# Patient Record
Sex: Female | Born: 1965 | Race: Black or African American | Hispanic: No | Marital: Single | Smoking: Never smoker
Health system: Southern US, Community
[De-identification: ages and names within clinical notes are randomized; demographics above are authoritative.]

## PROBLEM LIST (undated history)

## (undated) DIAGNOSIS — E785 Hyperlipidemia, unspecified: Secondary | ICD-10-CM

## (undated) DIAGNOSIS — G4733 Obstructive sleep apnea (adult) (pediatric): Secondary | ICD-10-CM

## (undated) DIAGNOSIS — R7989 Other specified abnormal findings of blood chemistry: Secondary | ICD-10-CM

## (undated) DIAGNOSIS — E042 Nontoxic multinodular goiter: Secondary | ICD-10-CM

## (undated) DIAGNOSIS — E559 Vitamin D deficiency, unspecified: Secondary | ICD-10-CM

## (undated) DIAGNOSIS — R011 Cardiac murmur, unspecified: Secondary | ICD-10-CM

## (undated) DIAGNOSIS — M5430 Sciatica, unspecified side: Secondary | ICD-10-CM

## (undated) DIAGNOSIS — E119 Type 2 diabetes mellitus without complications: Secondary | ICD-10-CM

## (undated) DIAGNOSIS — K219 Gastro-esophageal reflux disease without esophagitis: Secondary | ICD-10-CM

## (undated) DIAGNOSIS — I10 Essential (primary) hypertension: Secondary | ICD-10-CM

## (undated) DIAGNOSIS — E041 Nontoxic single thyroid nodule: Secondary | ICD-10-CM

## (undated) DIAGNOSIS — K760 Fatty (change of) liver, not elsewhere classified: Secondary | ICD-10-CM

## (undated) HISTORY — DX: Nontoxic single thyroid nodule: E04.1

## (undated) HISTORY — PX: TONSILLECTOMY: SUR1361

## (undated) HISTORY — DX: Type 2 diabetes mellitus without complications: E11.9

## (undated) HISTORY — DX: Vitamin D deficiency, unspecified: E55.9

## (undated) HISTORY — DX: Hyperlipidemia, unspecified: E78.5

## (undated) HISTORY — DX: Nontoxic multinodular goiter: E04.2

## (undated) HISTORY — DX: Essential (primary) hypertension: I10

## (undated) HISTORY — DX: Other specified abnormal findings of blood chemistry: R79.89

## (undated) HISTORY — DX: Gastro-esophageal reflux disease without esophagitis: K21.9

## (undated) HISTORY — DX: Sciatica, unspecified side: M54.30

## (undated) HISTORY — DX: Fatty (change of) liver, not elsewhere classified: K76.0

## (undated) HISTORY — DX: Obstructive sleep apnea (adult) (pediatric): G47.33

## (undated) HISTORY — DX: Cardiac murmur, unspecified: R01.1

---

## 1998-04-25 ENCOUNTER — Other Ambulatory Visit: Admission: RE | Admit: 1998-04-25 | Discharge: 1998-04-25 | Payer: Self-pay | Admitting: Obstetrics and Gynecology

## 1999-04-24 ENCOUNTER — Other Ambulatory Visit: Admission: RE | Admit: 1999-04-24 | Discharge: 1999-04-24 | Payer: Self-pay | Admitting: Obstetrics and Gynecology

## 2000-11-21 ENCOUNTER — Other Ambulatory Visit: Admission: RE | Admit: 2000-11-21 | Discharge: 2000-11-21 | Payer: Self-pay | Admitting: Obstetrics and Gynecology

## 2016-10-28 ENCOUNTER — Encounter: Payer: Self-pay | Admitting: Gastroenterology

## 2016-12-10 ENCOUNTER — Other Ambulatory Visit: Payer: Self-pay

## 2016-12-10 ENCOUNTER — Ambulatory Visit (AMBULATORY_SURGERY_CENTER): Payer: Self-pay

## 2016-12-10 VITALS — Ht 61.0 in | Wt 171.0 lb

## 2016-12-10 DIAGNOSIS — Z1211 Encounter for screening for malignant neoplasm of colon: Secondary | ICD-10-CM

## 2016-12-10 MED ORDER — NA SULFATE-K SULFATE-MG SULF 17.5-3.13-1.6 GM/177ML PO SOLN: 1.0000 | Freq: Once | ORAL | 0 refills | Status: AC

## 2016-12-10 NOTE — Progress Notes (Signed)
Denies allergies to eggs or soy products. Denies complication of anesthesia or sedation. Denies use of weight loss medication. Denies use of O2.   Emmi instructions declined.  

## 2016-12-31 ENCOUNTER — Ambulatory Visit (AMBULATORY_SURGERY_CENTER): Payer: BLUE CROSS/BLUE SHIELD | Admitting: Gastroenterology

## 2016-12-31 ENCOUNTER — Other Ambulatory Visit: Payer: Self-pay

## 2016-12-31 ENCOUNTER — Encounter: Payer: Self-pay | Admitting: Gastroenterology

## 2016-12-31 VITALS — BP 134/68 | HR 99 | Temp 97.3°F | Resp 24 | Ht 61.0 in | Wt 171.0 lb

## 2016-12-31 DIAGNOSIS — Z1211 Encounter for screening for malignant neoplasm of colon: Secondary | ICD-10-CM

## 2016-12-31 DIAGNOSIS — D122 Benign neoplasm of ascending colon: Secondary | ICD-10-CM

## 2016-12-31 MED ORDER — SODIUM CHLORIDE 0.9 % IV SOLN: 500.0000 mL | INTRAVENOUS | Status: DC

## 2016-12-31 NOTE — Op Note (Signed)
[facility] Patient Name: Lori Bruce Procedure Date: 12/31/2016 9:05 AM MRN: [id number] Endoscopist: Remo Lipps P. Armbruster MD, MD Age: 51 Referring MD:  Date of Birth: 21-Sep-1965 Gender: Female Account #: [id number] Procedure:                Colonoscopy Indications:              Screening for colorectal malignant neoplasm, This                            is the patient's first colonoscopy Medicines:                Monitored Anesthesia Care Procedure:                Pre-Anesthesia Assessment:                           - Prior to the procedure, a History and Physical                            was performed, and patient medications and                            allergies were reviewed. The patient's tolerance of                            previous anesthesia was also reviewed. The risks                            and benefits of the procedure and the sedation                            options and risks were discussed with the patient.                            All questions were answered, and informed consent                            was obtained. Prior Anticoagulants: The patient has                            taken no previous anticoagulant or antiplatelet                            agents. ASA Grade Assessment: II - A patient with                            mild systemic disease. After reviewing the risks                            and benefits, the patient was deemed in                            satisfactory condition to undergo the procedure.  After obtaining informed consent, the colonoscope                            was passed under direct vision. Throughout the                            procedure, the patient's blood pressure, pulse, and                            oxygen saturations were monitored continuously. The                            Colonoscope was introduced through the anus and                            advanced to the the  cecum, identified by                            appendiceal orifice and ileocecal valve. The                            colonoscopy was performed without difficulty. The                            patient tolerated the procedure well. The quality                            of the bowel preparation was good. The ileocecal                            valve, appendiceal orifice, and rectum were                            photographed. Scope In: 9:07:10 AM Scope Out: 9:18:50 AM Scope Withdrawal Time: 0 hours 10 minutes 37 seconds  Total Procedure Duration: 0 hours 11 minutes 40 seconds  Findings:                 The perianal and digital rectal examinations were                            normal.                           A 5 mm polyp was found in the ascending colon. The                            polyp was sessile. The polyp was removed with a                            cold snare. Resection and retrieval were complete.                           Multiple medium-mouthed diverticula were found in  the sigmoid colon.                           Internal hemorrhoids were found during                            retroflexion. The hemorrhoids were small.                           The exam was otherwise without abnormality. Complications:            No immediate complications. Estimated blood loss:                            Minimal. Estimated Blood Loss:     Estimated blood loss was minimal. Impression:               - One 5 mm polyp in the ascending colon, removed                            with a cold snare. Resected and retrieved.                           - Diverticulosis in the sigmoid colon.                           - Internal hemorrhoids.                           - The examination was otherwise normal. Recommendation:           - Patient has a contact number available for                            emergencies. The signs and symptoms of potential                             delayed complications were discussed with the                            patient. Return to normal activities tomorrow.                            Written discharge instructions were provided to the                            patient.                           - Resume previous diet.                           - Continue present medications.                           - Await pathology results.                           -  Repeat colonoscopy is recommended for                            surveillance. The colonoscopy date will be                            determined after pathology results from today's                            exam become available for review.                           - No ibuprofen, naproxen, or other non-steroidal                            anti-inflammatory drugs for 2 weeks after polyp                            removal. Remo Lipps P. Armbruster MD, MD 12/31/2016 9:23:47 AM This report has been signed electronically.

## 2016-12-31 NOTE — Patient Instructions (Signed)
YOU HAD AN ENDOSCOPIC PROCEDURE TODAY AT [facility]:   Refer to the procedure report that was given to you for any specific questions about what was found during the examination.  If the procedure report does not answer your questions, please call your gastroenterologist to clarify.  If you requested that your care partner not be given the details of your procedure findings, then the procedure report has been included in a sealed envelope for you to review at your convenience later.  YOU SHOULD EXPECT: Some feelings of bloating in the abdomen. Passage of more gas than usual.  Walking can help get rid of the air that was put into your GI tract during the procedure and reduce the bloating. If you had a lower endoscopy (such as a colonoscopy or flexible sigmoidoscopy) you may notice spotting of blood in your stool or on the toilet paper. If you underwent a bowel prep for your procedure, you may not have a normal bowel movement for a few days.  Please Note:  You might notice some irritation and congestion in your nose or some drainage.  This is from the oxygen used during your procedure.  There is no need for concern and it should clear up in a day or so.  SYMPTOMS TO REPORT IMMEDIATELY:   Following lower endoscopy (colonoscopy or flexible sigmoidoscopy):  Excessive amounts of blood in the stool  Significant tenderness or worsening of abdominal pains  Swelling of the abdomen that is new, acute  Fever of 100F or higher Please see handouts on Diverticulosis, Hemorrhoids and Polyps.  For urgent or emergent issues, a gastroenterologist can be reached at any hour by calling [phone].   DIET:  We do recommend a small meal at first, but then you may proceed to your regular diet.  Drink plenty of fluids but you should avoid alcoholic beverages for 24 hours.  ACTIVITY:  You should plan  easy for the rest of today and you should NOT DRIVE or use heavy machinery until tomorrow (because  of the sedation medicines used during the test).    FOLLOW UP: Our staff will call the number listed on your records the next business day following your procedure to check on you and address any questions or concerns that you may have regarding the information given to you following your procedure. If we do not reach you, we will leave a message.  However, if you are feeling well and you are not experiencing any problems, there is no need to return our call.  We will assume that you have returned to your regular daily activities without incident.  If any biopsies were taken you will be contacted by phone or by letter within the next 1-3 weeks.  Please call us at [phone] if you have not heard about the biopsies in 3 weeks.    SIGNATURES/CONFIDENTIALITY: You and/or your care partner have signed paperwork which will be entered into your electronic medical record.  These signatures attest to the fact that that the information above on your After Visit Summary has been reviewed and is understood.  Full responsibility of the confidentiality of this discharge information lies with you and/or your care-partner.  Thank you for letting us take care of your healthcare needs today.

## 2016-12-31 NOTE — Progress Notes (Signed)
Called to room to assist during endoscopic procedure.  Patient ID and intended procedure confirmed with present staff. Received instructions for my participation in the procedure from the performing physician.  

## 2016-12-31 NOTE — Progress Notes (Signed)
To PACU, VSS. Report to [email or web address] 

## 2017-01-04 ENCOUNTER — Encounter: Payer: Self-pay | Admitting: Gastroenterology

## 2017-01-05 ENCOUNTER — Telehealth: Payer: Self-pay | Admitting: *Deleted

## 2017-01-05 NOTE — Telephone Encounter (Signed)
  Follow up Call-  Call back number 12/31/2016  Post procedure Call Back phone  # [phone]  Permission to leave phone message Yes  Some recent data might be hidden     Patient questions:  Do you have a fever, pain , or abdominal swelling? No. Pain Score  0 *  Have you tolerated food without any problems? Yes.    Have you been able to return to your normal activities? Yes.    Do you have any questions about your discharge instructions: Diet   No. Medications  No. Follow up visit  No.  Do you have questions or concerns about your Care? No.  Actions: * If pain score is 4 or above: No action needed, pain <4.

## 2018-03-13 ENCOUNTER — Other Ambulatory Visit: Payer: Self-pay | Admitting: Obstetrics and Gynecology

## 2018-03-13 DIAGNOSIS — N6489 Other specified disorders of breast: Secondary | ICD-10-CM

## 2018-03-15 ENCOUNTER — Ambulatory Visit: Admission: RE | Admit: 2018-03-15 | Payer: BLUE CROSS/BLUE SHIELD | Source: Ambulatory Visit

## 2018-03-15 ENCOUNTER — Ambulatory Visit
Admission: RE | Admit: 2018-03-15 | Discharge: 2018-03-15 | Disposition: A | Discharge status: Home / Self Care | Payer: BLUE CROSS/BLUE SHIELD | Source: Ambulatory Visit | Attending: Obstetrics and Gynecology | Admitting: Obstetrics and Gynecology

## 2018-03-15 DIAGNOSIS — N6489 Other specified disorders of breast: Secondary | ICD-10-CM

## 2019-08-01 IMAGING — MG DIGITAL DIAGNOSTIC UNILATERAL RIGHT MAMMOGRAM WITH TOMO AND CAD
3 series · 3 of 7 positions shown · non-contrast
Comparison: Previous exam(s).

CLINICAL DATA: Possible asymmetry in the outer right breast in the
craniocaudal projection of a recent screening mammogram.

EXAM:
DIGITAL DIAGNOSTIC UNILATERAL RIGHT MAMMOGRAM WITH CAD AND TOMO

[R CC synth-2D]
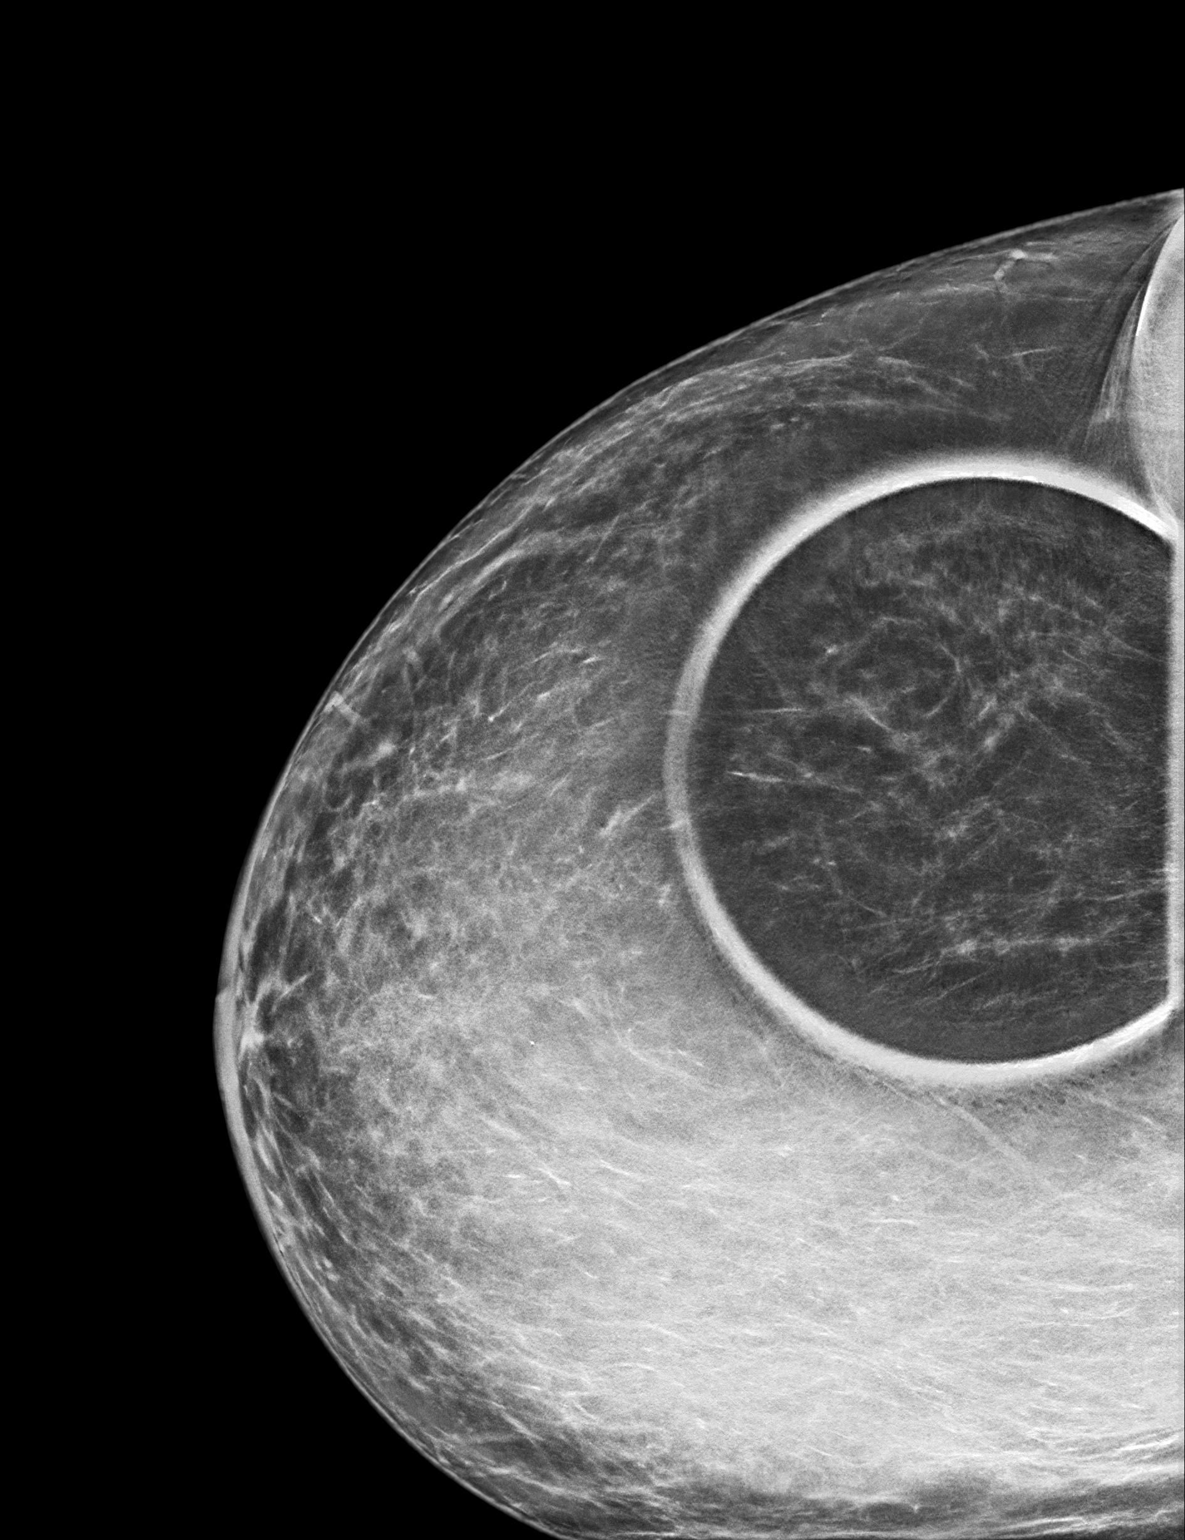

[R ML synth-2D]
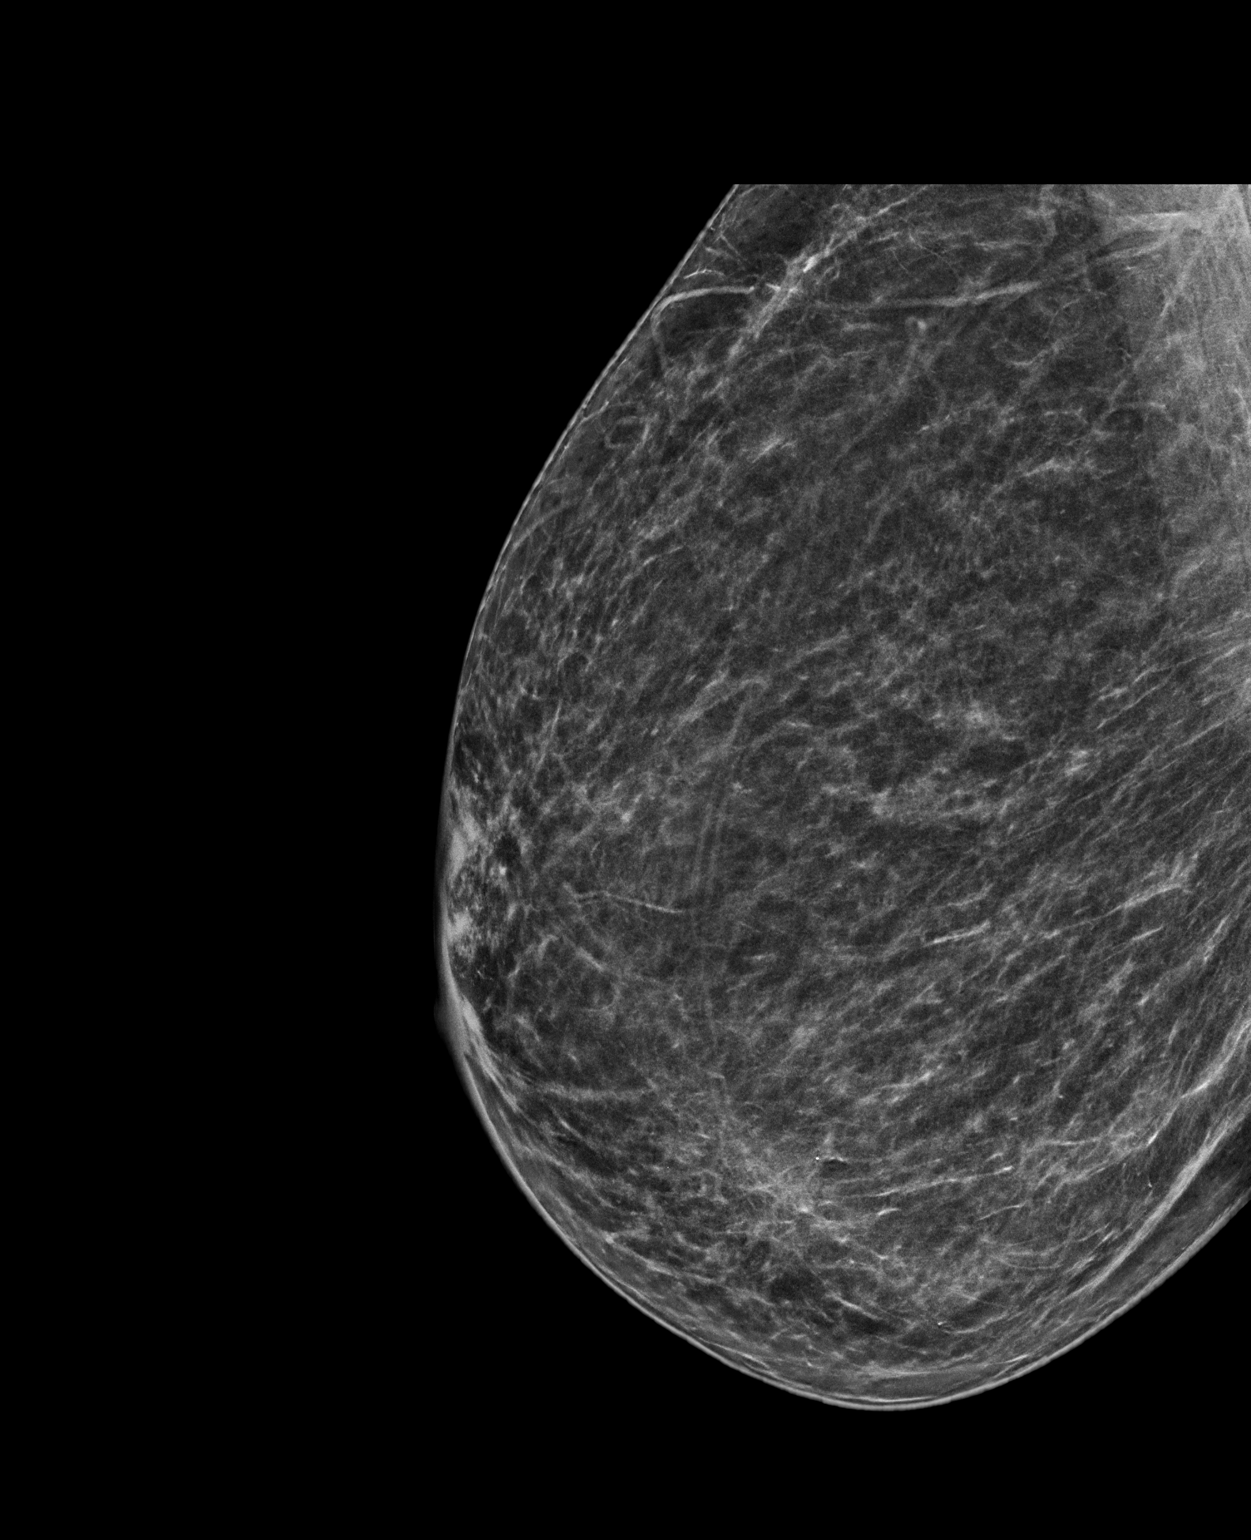

[R CC tomo · tomo slice 39/76.0]
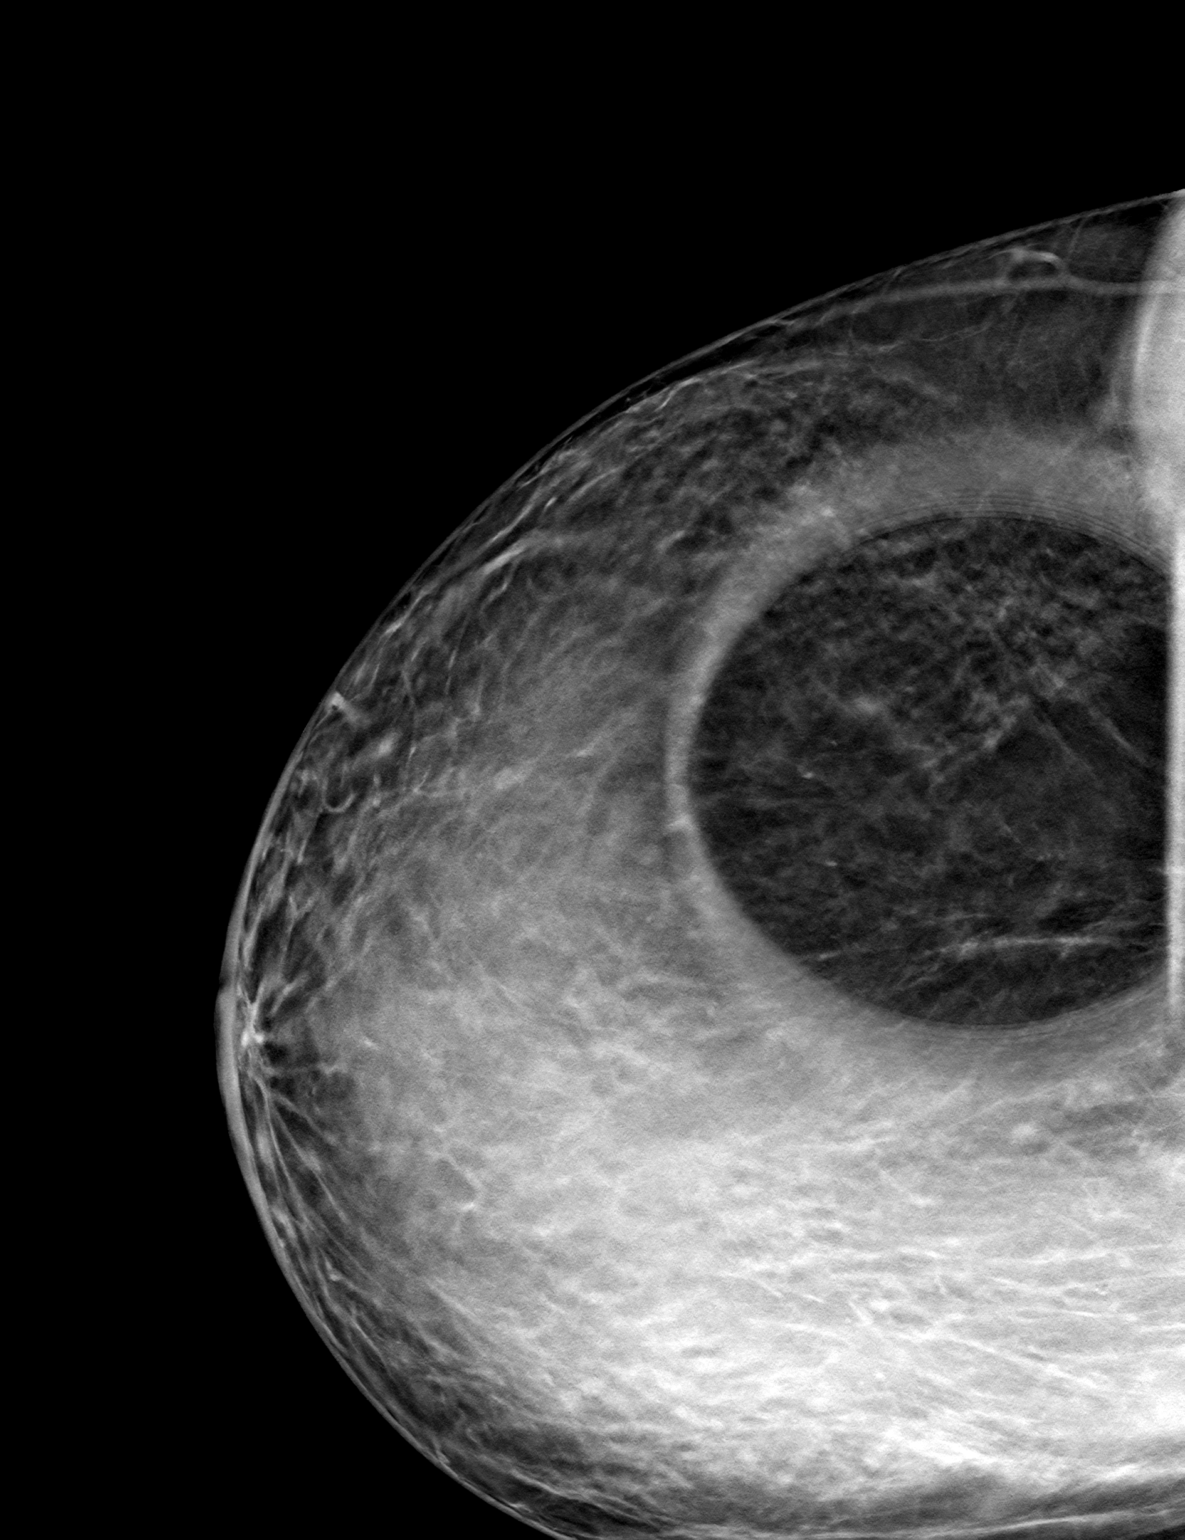

[3 of 7 positions shown; findings below may reference images not displayed]

ACR Breast Density Category b: There are scattered areas of
fibroglandular density.
FINDINGS: 3D tomographic and 2D generated true lateral and spot compression
craniocaudal views of the right breast demonstrate normal appearing
fibroglandular tissue at the location of recently suspected
asymmetry.

Mammographic images were processed with CAD.
IMPRESSION: No evidence of malignancy. The recently suspected right breast
asymmetry was close apposition of normal breast tissue.

RECOMMENDATION:
Bilateral screening mammogram in 1 year.

I have discussed the findings and recommendations with the patient.
Results were also provided in writing at the conclusion of the
visit. If applicable, a reminder letter will be sent to the patient
regarding the next appointment.

BI-RADS CATEGORY  1: Negative.

## 2020-05-21 ENCOUNTER — Ambulatory Visit: Payer: BLUE CROSS/BLUE SHIELD | Admitting: Gastroenterology

## 2020-06-05 ENCOUNTER — Encounter: Payer: Self-pay | Admitting: Physician Assistant

## 2020-06-30 ENCOUNTER — Encounter: Payer: Self-pay | Admitting: Physician Assistant

## 2020-06-30 ENCOUNTER — Other Ambulatory Visit (INDEPENDENT_AMBULATORY_CARE_PROVIDER_SITE_OTHER): Payer: BLUE CROSS/BLUE SHIELD

## 2020-06-30 ENCOUNTER — Ambulatory Visit: Payer: BLUE CROSS/BLUE SHIELD | Admitting: Physician Assistant

## 2020-06-30 VITALS — BP 140/80 | HR 80 | Ht 60.75 in | Wt 176.1 lb

## 2020-06-30 DIAGNOSIS — R7989 Other specified abnormal findings of blood chemistry: Secondary | ICD-10-CM

## 2020-06-30 DIAGNOSIS — K760 Fatty (change of) liver, not elsewhere classified: Secondary | ICD-10-CM

## 2020-06-30 LAB — HEPATIC FUNCTION PANEL
ALT: 29 U/L (ref 0–35)
AST: 15 U/L (ref 0–37)
Albumin: 4.5 g/dL (ref 3.5–5.2)
Alkaline Phosphatase: 115 U/L (ref 39–117)
Bilirubin, Direct: 0.1 mg/dL (ref 0.0–0.3)
Total Bilirubin: 0.4 mg/dL (ref 0.2–1.2)
Total Protein: 7.4 g/dL (ref 6.0–8.3)

## 2020-06-30 LAB — PROTIME-INR
INR: 1 ratio (ref 0.8–1.0)
Prothrombin Time: 11.6 s (ref 9.6–13.1)

## 2020-06-30 LAB — SEDIMENTATION RATE: Sed Rate: 18 mm/hr (ref 0–30)

## 2020-06-30 LAB — FERRITIN: Ferritin: 54.8 ng/mL (ref 10.0–291.0)

## 2020-06-30 NOTE — Patient Instructions (Addendum)
If you are age 55 or younger, your body mass index should be between 19-25. Your Body mass index is 33.55 kg/m. If this is out of the aformentioned range listed, please consider follow up with your Primary Care Provider.  __________________________________________________________  The [facility] providers would like to encourage you to use [facility] to communicate with providers for non-urgent requests or questions.  Due to long hold times on the telephone, sending your provider a message by [facility] may be a faster and more efficient way to get a response.  Please allow 48 business hours for a response.  Please remember that this is for non-urgent requests.   Your provider has requested that you go to the basement level for lab work before leaving today. Press "B" on the elevator. The lab is located at the first door on the left as you exit the elevator.  You have been scheduled to follow up with Dr. Havery Moros on August 21, 2020 at 11:00 am  Thank you for entrusting me with your care and choosing [facility].  Amy Esterwood, PA-C

## 2020-06-30 NOTE — Progress Notes (Signed)
Subjective:    Patient ID: Lori Bruce, female    DOB: 1965/02/27, 55 y.o.   MRN: [id number]  HPI Anderson is a pleasant 55 year old African-American female, established with Dr. Havery Moros who is referred today by her PCP Dr. Lorella Nimrod for evaluation of elevated liver function studies. She was last seen here in December 2018 for screening colonoscopy and was found to have one 5 mm polyp in the ascending colon which was a tubular adenoma, and noted to have multiple diverticuli in the sigmoid colon and internal hemorrhoids.  She was indicated for 5-year interval follow-up. Patient has had persistently elevated LFTs over the past several months.  Reviewing the attached labs she had normal LFTs in September 2021 with the exception of an alk phos of 120 January 2022-AST 43/ALT 105/T bili was 0.3/alk phos 143 In March 2022 AST 40/ALT 158/alk phos 179/T bili 0.4 WBC 4.4/hemoglobin 12/hematocrit 37/platelets 330 Hemoglobin A1c 7.2 Cholesterol 208/triglycerides 59.  Patient has not been told in the past that she has ever had elevated liver function studies. She also had up to upper abdominal ultrasound March 2022 which showed an echogenic appearing liver measuring 17 cm, gallbladder normal CBD 4 mm findings consistent with suspected steatosis.  Family history is negative for liver disease as far she is aware of though her brother did have a clotting disorder of some sort with hypercoagulability. No history of hepatitis, has not been vaccinated for hepatitis B to her knowledge. She says she feels fine has no complaints of abdominal discomfort, no issues with nausea or vomiting, no heartburn or indigestion.  Appetite good and weight has been stable. She does drink alcohol socially perhaps 1 or 2 drinks per week. Reviewing her meds she is on Lipitor and says she has been on this over the past 3 to 4 years.,  Also on Janumet.   Review of Systems Pertinent positive and negative review  of systems were noted in the above HPI section.  All other review of systems was otherwise negative.  Outpatient Encounter Medications as of 06/30/2020  Medication Sig  . amLODipine (NORVASC) 5 MG tablet Take 5 mg daily by mouth.  Marland Kitchen atorvastatin (LIPITOR) 10 MG tablet Take 10 mg daily by mouth.  . ergocalciferol (VITAMIN D2) 1.25 MG (50000 UT) capsule Take 1 capsule by mouth 2 (two) times a week.  Marland Kitchen lisinopril (PRINIVIL,ZESTRIL) 40 MG tablet Take 40 mg daily by mouth.  . sitaGLIPtin-metformin (JANUMET) 50-500 MG tablet Take 1 tablet 2 (two) times daily with a meal by mouth.  . triamcinolone ointment (KENALOG) 0.1 % Apply 1 application topically as needed.   Facility-Administered Encounter Medications as of 06/30/2020  Medication  . 0.9 %  sodium chloride infusion   No Known Allergies There are no problems to display for this patient.  Social History   Socioeconomic History  . Marital status: Single    Spouse name: Not on file  . Number of children: 1  . Years of education: Not on file  . Highest education level: Not on file  Occupational History  . Occupation: [employer]  Tobacco Use  . Smoking status: Never Smoker  . Smokeless tobacco: Never Used  Vaping Use  . Vaping Use: Never used  Substance and Sexual Activity  . Alcohol use: Yes    Comment: wine 2 or three times a week  . Drug use: No  . Sexual activity: Not on file  Other Topics Concern  . Not on file  Social History  Narrative  . Not on file   Social Determinants of Health   Financial Resource Strain: Not on file  Food Insecurity: Not on file  Transportation Needs: Not on file  Physical Activity: Not on file  Stress: Not on file  Social Connections: Not on file  Intimate Partner Violence: Not on file    Ms. Windle's family history includes Diabetes in her maternal grandmother; Heart disease in her father and paternal grandfather; Hypertension in her maternal grandmother and paternal  grandmother; Stroke in her paternal uncle.      Objective:    Vitals:   06/30/20 1045  BP: 140/80  Pulse: 80    Physical Exam Well-developed well-nourished African-American female in no acute distress.  Height, Weight, 176 BMI 33.5  HEENT; nontraumatic normocephalic, EOMI, PE R LA, sclera anicteric. Oropharynx; not examined today Neck; supple, no JVD Cardiovascular; regular rate and rhythm with S1-S2, no murmur rub or gallop Pulmonary; Clear bilaterally Abdomen; soft, nontender, nondistended, no palpable mass liver palpable just at the right costal margin, bowel sounds are active Rectal; not done Skin; benign exam, no jaundice rash or appreciable lesions Extremities; no clubbing cyanosis or edema skin warm and dry Neuro/Psych; alert and oriented x4, grossly nonfocal mood and affect appropriate       Assessment & Plan:   #62 55 year old African-American female with persistently elevated LFTs over the past 5 months, and recent abdominal ultrasound showing suspected hepatic steatosis  Will need to rule out NASH, rule out other underlying chronic liver disease, rule out medication induced elevation of liver function studies i.e. Lipitor  #2 history of tubular adenomatous colon polyp-up-to-date with screening last done December 2018 and indicated for 5 to 7-year interval follow-up #3.  Diverticulosis #4 hyperlipidemia #5 obesity  Plan; repeat hepatic panel today, check hepatitis serologies, ferritin, ceruloplasmin, alpha-1 antitrypsin, ANA, smooth muscle antibodies, mitochondrial antibodies,   Initiated discussion today regarding management of nonalcoholic fatty liver disease with emphasis towards weight loss of 5 to 7%, moderate exercise 5 days/week, limiting alcohol use significantly, and management of hyperlipidemia.  Patient will be scheduled for follow-up office visit with Dr. Havery Moros. Further recommendations pending results of above.  If all markers are negative may  need to consider trial off Lipitor  Plan  Kameela Leipold Genia Harold PA-C 06/30/2020   Cc: Johny Drilling, MD

## 2020-07-01 NOTE — Progress Notes (Signed)
Agree with assessment and plan as outlined.  

## 2020-07-03 LAB — CERULOPLASMIN: Ceruloplasmin: 31 mg/dL (ref 18–53)

## 2020-07-03 LAB — HEPATITIS B SURFACE ANTIGEN: Hepatitis B Surface Ag: NONREACTIVE

## 2020-07-03 LAB — ALPHA-1-ANTITRYPSIN: A-1 Antitrypsin, Ser: 117 mg/dL (ref 83–199)

## 2020-07-03 LAB — HEPATITIS A ANTIBODY, TOTAL: Hepatitis A AB,Total: NONREACTIVE

## 2020-07-03 LAB — HEPATITIS C ANTIBODY
Hepatitis C Ab: NONREACTIVE
SIGNAL TO CUT-OFF: 0.01 (ref <1.00)

## 2020-07-03 LAB — MITOCHONDRIAL ANTIBODIES: Mitochondrial M2 Ab, IgG: <=20 U

## 2020-07-03 LAB — ANTI-SMOOTH MUSCLE ANTIBODY, IGG: Actin (Smooth Muscle) Antibody (IGG): <20 U (ref <20)

## 2020-07-03 LAB — ANA: Anti Nuclear Antibody (ANA): NEGATIVE

## 2020-07-03 LAB — HEPATITIS B SURFACE ANTIBODY,QUALITATIVE: Hep B S Ab: NONREACTIVE

## 2020-08-21 ENCOUNTER — Ambulatory Visit (INDEPENDENT_AMBULATORY_CARE_PROVIDER_SITE_OTHER): Payer: BC Managed Care – PPO | Admitting: Gastroenterology

## 2020-08-21 ENCOUNTER — Encounter: Payer: Self-pay | Admitting: Gastroenterology

## 2020-08-21 VITALS — BP 138/68 | HR 76 | Ht 60.75 in | Wt 177.0 lb

## 2020-08-21 DIAGNOSIS — Z23 Encounter for immunization: Secondary | ICD-10-CM

## 2020-08-21 DIAGNOSIS — R7989 Other specified abnormal findings of blood chemistry: Secondary | ICD-10-CM | POA: Diagnosis not present

## 2020-08-21 DIAGNOSIS — K760 Fatty (change of) liver, not elsewhere classified: Secondary | ICD-10-CM | POA: Diagnosis not present

## 2020-08-21 NOTE — Progress Notes (Signed)
HPI :  55 year old female here for follow-up visit for fatty liver disease and elevated liver enzymes.  She recently established with Nicoletta Ba in June.  At that time her history was remarkable for normal LFTs last year but then in January of this year had an ALT of 105 and alk phos of 143 with an AST of 43.  In March 2022 ALT had risen to 158 and alk phos to 179, as well as AST 40.  She had her Lipitor held.  Her hemoglobin A1c is 7.2 and she is on Januvia.  Her work-up was remarkable for right upper quadrant ultrasound in March 2022 showing fatty liver.  She underwent a serologic work-up for other chronic liver diseases which has been negative to date.  She denies any over-the-counter supplements or herbal medications.  She denies any history of routine alcohol use, at most she drinks 1 to 2 glasses of wine a week.  She has never had jaundice.  No abdominal pain.  She states that her weight has been stable over the past year around 170.  She denies any weight gain around the time that her liver enzymes went up.  She otherwise feels well without complaints.  Interestingly when she had her liver enzymes repeated in June they were normal, ALT of 29, AST 15, alk phos 115.  She is not immune to hepatitis A or B   Past Medical History:  Diagnosis Date   Diabetes mellitus without complication (HCC)    Dyslipidemia    Elevated LFTs    Fatty liver    GERD (gastroesophageal reflux disease)    Heart murmur    Hypertension    Multinodular goiter    OSA (obstructive sleep apnea)    Sciatica    Thyroid nodule    Vitamin D deficiency      Past Surgical History:  Procedure Laterality Date   CESAREAN SECTION     TONSILLECTOMY     Family History  Problem Relation Age of Onset   Heart disease Father    Diabetes Maternal Grandmother    Hypertension Maternal Grandmother    Hypertension Paternal Grandmother    Heart disease Paternal Grandfather    Stroke Paternal Uncle    Colon cancer  Neg Hx    Esophageal cancer Neg Hx    Pancreatic cancer Neg Hx    Rectal cancer Neg Hx    Stomach cancer Neg Hx    Social History   Tobacco Use   Smoking status: Never   Smokeless tobacco: Never  Vaping Use   Vaping Use: Never used  Substance Use Topics   Alcohol use: Yes    Comment: wine 2 or three times a week   Drug use: No   Current Outpatient Medications  Medication Sig Dispense Refill   amLODipine (NORVASC) 5 MG tablet Take 5 mg daily by mouth.     atorvastatin (LIPITOR) 10 MG tablet Take 10 mg daily by mouth.     ergocalciferol (VITAMIN D2) 1.25 MG (50000 UT) capsule Take 1 capsule by mouth 2 (two) times a week.     lisinopril (PRINIVIL,ZESTRIL) 40 MG tablet Take 40 mg daily by mouth.     sitaGLIPtin-metformin (JANUMET) 50-500 MG tablet Take 1 tablet 2 (two) times daily with a meal by mouth.     triamcinolone ointment (KENALOG) 0.1 % Apply 1 application topically as needed.     No current facility-administered medications for this visit.   No Known Allergies   Review  of Systems: All systems reviewed and negative except where noted in HPI.  Lab Results  Component Value Date   ALT 29 06/30/2020   AST 15 06/30/2020   ALKPHOS 115 06/30/2020   BILITOT 0.4 06/30/2020    No results found for: WBC, HGB, HCT, MCV, PLT    Physical Exam: BP 138/68   Pulse 76   Ht 5' 0.75" (1.543 m)   Wt 177 lb (80.3 kg)   BMI 33.72 kg/m  Constitutional: Pleasant,well-developed, female in no acute distress. Neurological: Alert and oriented to person place and time. Psychiatric: Normal mood and affect. Behavior is normal.   ASSESSMENT AND PLAN: 55 year old female here for reassessment of the following:  Fatty liver disease Elevated liver enzymes  Elevated liver enzymes most likely due to fatty liver disease, although she stopped Lipitor and her liver enzymes have seem to normalize, she had been on Lipitor for a while previously.  We spent some time discussing fatty liver  disease, risks for fibrotic change and ultimately cirrhosis.  I do recommend weight loss over time, ideally to normalize body mass index if she can which can really help with this as well as her diabetes.  She does not exercise at all we discussed that would be encouraged as well.  As long as her liver enzymes are normal I do not feel that we need to proceed with liver biopsy or other invasive measures right now.  I would like to repeat her liver enzymes next month to trend after resuming Lipitor.  We discussed routine coffee intake can help prevent fibrotic change and would recommend she do that if she can.  She is not immune to hepatitis A and B and will provide vaccination today with Twinrix to start the series.  She is agreeable to that.  She should she has at least once yearly moving forward, I will make recommendations on timing of her next liver enzymes based on results of her next lab draw.  She agreed with the plan, all questions answered.  Jolly Mango, MD [facility]

## 2020-08-21 NOTE — Patient Instructions (Addendum)
If you are age 55 or older, your body mass index should be between 23-30. Your Body mass index is 33.72 kg/m. If this is out of the aforementioned range listed, please consider follow up with your Primary Care Provider.  If you are age 66 or younger, your body mass index should be between 19-25. Your Body mass index is 33.72 kg/m. If this is out of the aformentioned range listed, please consider follow up with your Primary Care Provider.   __________________________________________________________  The [facility] providers would like to encourage you to use [facility] to communicate with providers for non-urgent requests or questions.  Due to long hold times on the telephone, sending your provider a message by [facility] may be a faster and more efficient way to get a response.  Please allow 48 business hours for a response.  Please remember that this is for non-urgent requests.    We are giving you a Twinrix vaccine today to protect you from Hepatitis A and B.  You will be due for your 2nd injection in one month.  We have scheduled you for Monday, August 29 at 9:30 am.   Please go to the lab in the basement of our building to have lab work done in 2 to 4 weeks. They are open Monday through Friday from 7:30 am to 5:00 pm Monday through Friday.  Hit "B" for basement when you get on the elevator.  When the doors open the lab is on your left.  We will call you with the results. Thank you.   Thank you for entrusting me with your care and for choosing [facility], Dr. Granite Cellar

## 2020-09-12 ENCOUNTER — Other Ambulatory Visit (INDEPENDENT_AMBULATORY_CARE_PROVIDER_SITE_OTHER): Payer: BC Managed Care – PPO

## 2020-09-12 DIAGNOSIS — K760 Fatty (change of) liver, not elsewhere classified: Secondary | ICD-10-CM | POA: Diagnosis not present

## 2020-09-12 DIAGNOSIS — R7989 Other specified abnormal findings of blood chemistry: Secondary | ICD-10-CM | POA: Diagnosis not present

## 2020-09-12 LAB — HEPATIC FUNCTION PANEL
ALT: 14 U/L (ref 0–35)
AST: 10 U/L (ref 0–37)
Albumin: 4.4 g/dL (ref 3.5–5.2)
Alkaline Phosphatase: 86 U/L (ref 39–117)
Bilirubin, Direct: 0.1 mg/dL (ref 0.0–0.3)
Total Bilirubin: 0.4 mg/dL (ref 0.2–1.2)
Total Protein: 7.4 g/dL (ref 6.0–8.3)

## 2020-09-15 ENCOUNTER — Other Ambulatory Visit: Payer: Self-pay

## 2020-09-15 DIAGNOSIS — R7989 Other specified abnormal findings of blood chemistry: Secondary | ICD-10-CM

## 2020-09-15 DIAGNOSIS — K760 Fatty (change of) liver, not elsewhere classified: Secondary | ICD-10-CM

## 2020-09-15 NOTE — Progress Notes (Signed)
Per [facility] patient will be due for Hepatic Function Panel in November

## 2020-09-22 ENCOUNTER — Ambulatory Visit (INDEPENDENT_AMBULATORY_CARE_PROVIDER_SITE_OTHER): Payer: BC Managed Care – PPO | Admitting: Gastroenterology

## 2020-09-22 DIAGNOSIS — Z23 Encounter for immunization: Secondary | ICD-10-CM

## 2020-12-26 ENCOUNTER — Other Ambulatory Visit (INDEPENDENT_AMBULATORY_CARE_PROVIDER_SITE_OTHER): Payer: BC Managed Care – PPO

## 2020-12-26 DIAGNOSIS — R7989 Other specified abnormal findings of blood chemistry: Secondary | ICD-10-CM | POA: Diagnosis not present

## 2020-12-26 DIAGNOSIS — K760 Fatty (change of) liver, not elsewhere classified: Secondary | ICD-10-CM

## 2020-12-26 LAB — HEPATIC FUNCTION PANEL
ALT: 36 U/L — ABNORMAL HIGH (ref 0–35)
AST: 15 U/L (ref 0–37)
Albumin: 4.6 g/dL (ref 3.5–5.2)
Alkaline Phosphatase: 121 U/L — ABNORMAL HIGH (ref 39–117)
Bilirubin, Direct: 0.1 mg/dL (ref 0.0–0.3)
Total Bilirubin: 0.3 mg/dL (ref 0.2–1.2)
Total Protein: 7.6 g/dL (ref 6.0–8.3)

## 2021-02-02 ENCOUNTER — Other Ambulatory Visit: Payer: Self-pay

## 2021-02-04 NOTE — Telephone Encounter (Signed)
Called and LM for patient that she is due for final Twinrix after 02-22-21. Asked her to call the office to schedule appointment. She is tentatively scheduled for 1-30 Monday at 10am. If this day or time works for her, no need to call back. Other wise it would be good to get this done within 2 weeks from 1-30 on a Monday, Thursday or Friday.

## 2021-02-23 ENCOUNTER — Ambulatory Visit (INDEPENDENT_AMBULATORY_CARE_PROVIDER_SITE_OTHER): Payer: BC Managed Care – PPO | Admitting: Gastroenterology

## 2021-02-23 DIAGNOSIS — Z23 Encounter for immunization: Secondary | ICD-10-CM | POA: Diagnosis not present

## 2021-02-23 NOTE — Progress Notes (Signed)
PATIENT REC'D FINAL Hep A and Hep B vaccines

## 2021-06-24 ENCOUNTER — Telehealth: Payer: Self-pay

## 2021-06-24 DIAGNOSIS — K760 Fatty (change of) liver, not elsewhere classified: Secondary | ICD-10-CM

## 2021-06-24 DIAGNOSIS — R7989 Other specified abnormal findings of blood chemistry: Secondary | ICD-10-CM

## 2021-06-24 NOTE — Telephone Encounter (Signed)
Called and spoke to patient. She will go to the lab one day next week. Orders are in

## 2021-06-24 NOTE — Telephone Encounter (Signed)
-----   Message from Roetta Sessions, Henriette sent at 12/27/2020  8:05 AM EST ----- Regarding: due for LFTs Patient due for repeat LFTs for fatty liver, elevated LFTs in June
